# Patient Record
Sex: Male | Born: 2000 | Marital: Single | Smoking: Never smoker
Health system: Southern US, Community
[De-identification: ages and names within clinical notes are randomized; demographics above are authoritative.]

## PROBLEM LIST (undated history)

## (undated) ENCOUNTER — Ambulatory Visit (HOSPITAL_COMMUNITY)

---
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
## 2021-02-27 ENCOUNTER — Emergency Department (HOSPITAL_COMMUNITY)
Admission: EM | Admit: 2021-02-27 | Discharge: 2021-02-27 | Discharge status: Against Medical Advice | Payer: Medicaid Other

## 2021-02-27 ENCOUNTER — Encounter (HOSPITAL_COMMUNITY): Payer: Self-pay

## 2021-02-27 ENCOUNTER — Ambulatory Visit (HOSPITAL_COMMUNITY)
Admission: EM | Admit: 2021-02-27 | Discharge: 2021-02-27 | Disposition: A | Discharge status: Home / Self Care | Payer: Medicaid Other | Attending: Student | Admitting: Student

## 2021-02-27 ENCOUNTER — Other Ambulatory Visit: Payer: Self-pay

## 2021-02-27 DIAGNOSIS — R509 Fever, unspecified: Secondary | ICD-10-CM

## 2021-02-27 DIAGNOSIS — J09X2 Influenza due to identified novel influenza A virus with other respiratory manifestations: Secondary | ICD-10-CM | POA: Diagnosis not present

## 2021-02-27 LAB — POC INFLUENZA A AND B ANTIGEN (URGENT CARE ONLY)
INFLUENZA A ANTIGEN, POC: POSITIVE — AB
INFLUENZA B ANTIGEN, POC: NEGATIVE

## 2021-02-27 MED ORDER — ACETAMINOPHEN 325 MG PO TABS
650.0000 mg | ORAL_TABLET | Freq: Once | ORAL | Status: AC
Start: 2021-02-27 — End: 2021-02-27
  Administered 2021-02-27: 650 mg via ORAL

## 2021-02-27 MED ORDER — PROMETHAZINE-DM 6.25-15 MG/5ML PO SYRP: 5.0000 mL | ORAL_SOLUTION | Freq: Four times a day (QID) | ORAL | 0 refills | Status: AC | PRN

## 2021-02-27 MED ORDER — OSELTAMIVIR PHOSPHATE 75 MG PO CAPS: 75.0000 mg | ORAL_CAPSULE | Freq: Two times a day (BID) | ORAL | 0 refills | Status: AC

## 2021-02-27 MED ORDER — ONDANSETRON 8 MG PO TBDP: 8.0000 mg | ORAL_TABLET | Freq: Three times a day (TID) | ORAL | 0 refills | Status: AC | PRN

## 2021-02-27 MED ORDER — ACETAMINOPHEN 325 MG PO TABS
ORAL_TABLET | ORAL | Status: AC
  Filled 2021-02-27: qty 2

## 2021-02-27 NOTE — ED Triage Notes (Signed)
Pt is c/o fever, cough, sore throat, chills, and headache for the past two days; pt denies exposure to COVID or flu

## 2021-02-27 NOTE — ED Provider Notes (Signed)
[facility]    CSN: [id number] Arrival date & time: 02/27/21  1730      History   Chief Complaint Chief Complaint  Patient presents with   Cough   Fever   Sore Throat   Headache    HPI Edward Bridges is a 20 y.o. male presenting with viral symptoms for 2 days.  Medical history noncontributory.  Following exposure to influenza both at home and school, describes subjective chills, nonproductive cough, sore throat, generalized body aches, headaches.  Advil provides some relief but has not taken this today.  Denies history of cardiopulmonary disease.  Tried to check his temperature at home but the thermometer was broken.  HPI  History reviewed. No pertinent past medical history.  Patient Active Problem List   Diagnosis Date Noted   Acne 06/23/2016    History reviewed. No pertinent surgical history.     Home Medications    Prior to Admission medications   Medication Sig Start Date End Date Taking? Authorizing Provider  ondansetron (ZOFRAN ODT) 8 MG disintegrating tablet Take 1 tablet (8 mg total) by mouth every 8 (eight) hours as needed for nausea or vomiting. 02/27/21  Yes Rhys Martini, PA-C  oseltamivir (TAMIFLU) 75 MG capsule Take 1 capsule (75 mg total) by mouth every 12 (twelve) hours. 02/27/21  Yes Rhys Martini, PA-C  promethazine-dextromethorphan (PROMETHAZINE-DM) 6.25-15 MG/5ML syrup Take 5 mLs by mouth 4 (four) times daily as needed for cough. 02/27/21  Yes Rhys Martini, PA-C  cetirizine (ZYRTEC) 10 MG tablet Take 1 tablet (10 mg total) by mouth at bedtime. Patient not taking: Reported on 06/23/2016 03/02/16   Lowanda Foster, NP  tretinoin (RETIN-A) 0.025 % cream Apply topically at bedtime. Use moisturizer as needed. 03/28/18   Tilman Neat, MD    Family History Family History  Problem Relation Age of Onset   Hypertension Mother    Diabetes Father 65       on oral hypoglycemic for several months   Hypertension Father    Heart disease  Brother 28       rheumatic heart disease   Heart disease Paternal Grandmother    Drug abuse Neg Hx    Early death Neg Hx    Obesity Neg Hx     Social History Social History   Tobacco Use   Smoking status: Never   Smokeless tobacco: Never  Substance Use Topics   Alcohol use: No   Drug use: No     Allergies   Patient has no known allergies.   Review of Systems Review of Systems  Constitutional:  Positive for appetite change, chills, fatigue and fever.  HENT:  Positive for congestion and sore throat. Negative for ear pain, rhinorrhea, sinus pressure and sinus pain.   Eyes:  Negative for redness and visual disturbance.  Respiratory:  Positive for cough. Negative for chest tightness, shortness of breath and wheezing.   Cardiovascular:  Negative for chest pain and palpitations.  Gastrointestinal:  Negative for abdominal pain, constipation, diarrhea, nausea and vomiting.  Genitourinary:  Negative for dysuria, frequency and urgency.  Musculoskeletal:  Positive for myalgias.  Neurological:  Negative for dizziness, weakness and headaches.  Psychiatric/Behavioral:  Negative for confusion.   All other systems reviewed and are negative.   Physical Exam Triage Vital Signs ED Triage Vitals  Enc Vitals Group     BP 02/27/21 1828 125/64     Pulse Rate 02/27/21 1828 (!) 114     Resp 02/27/21 1828  16     Temp 02/27/21 1828 (!) 103.1 F (39.5 C)     Temp Source 02/27/21 1828 Oral     SpO2 02/27/21 1828 97 %     Weight --      Height --      Head Circumference --      Peak Flow --      Pain Score 02/27/21 1830 9     Pain Loc --      Pain Edu? --      Excl. in GC? --    No data found.  Updated Vital Signs BP 125/64 (BP Location: Right Arm)   Pulse (!) 114   Temp (!) 103.1 F (39.5 C) (Oral)   Resp 16   SpO2 97%   Visual Acuity Right Eye Distance:   Left Eye Distance:   Bilateral Distance:    Right Eye Near:   Left Eye Near:    Bilateral Near:     Physical  Exam Vitals reviewed.  Constitutional:      General: He is not in acute distress.    Appearance: Normal appearance. He is ill-appearing.  HENT:     Head: Normocephalic and atraumatic.     Right Ear: Tympanic membrane, ear canal and external ear normal. No tenderness. No middle ear effusion. There is no impacted cerumen. Tympanic membrane is not perforated, erythematous, retracted or bulging.     Left Ear: Tympanic membrane, ear canal and external ear normal. No tenderness.  No middle ear effusion. There is no impacted cerumen. Tympanic membrane is not perforated, erythematous, retracted or bulging.     Nose: Nose normal. No congestion.     Mouth/Throat:     Mouth: Mucous membranes are moist.     Pharynx: Uvula midline. Posterior oropharyngeal erythema present. No oropharyngeal exudate.     Comments: On exam, uvula is midline, she is tolerating her secretions without difficulty, there is no trismus, no drooling, she has normal phonation  Eyes:     Extraocular Movements: Extraocular movements intact.     Pupils: Pupils are equal, round, and reactive to light.  Cardiovascular:     Rate and Rhythm: Regular rhythm. Tachycardia present.     Heart sounds: Normal heart sounds.  Pulmonary:     Effort: Pulmonary effort is normal.     Breath sounds: Normal breath sounds. No decreased breath sounds, wheezing, rhonchi or rales.  Abdominal:     Palpations: Abdomen is soft.     Tenderness: There is no abdominal tenderness. There is no guarding or rebound.  Neurological:     General: No focal deficit present.     Mental Status: He is alert and oriented to person, place, and time.  Psychiatric:        Mood and Affect: Mood normal.        Behavior: Behavior normal.        Thought Content: Thought content normal.        Judgment: Judgment normal.     UC Treatments / Results  Labs (all labs ordered are listed, but only abnormal results are displayed) Labs Reviewed  POC INFLUENZA A AND B ANTIGEN  (URGENT CARE ONLY) - Abnormal; Notable for the following components:      Result Value   INFLUENZA A ANTIGEN, POC POSITIVE (*)    All other components within normal limits    EKG   Radiology No results found.  Procedures Procedures (including critical care time)  Medications Ordered in UC Medications  acetaminophen (TYLENOL) tablet 650 mg (650 mg Oral Given 02/27/21 1838)    Initial Impression / Assessment and Plan / UC Course  I have reviewed the triage vital signs and the nursing notes.  Pertinent labs & imaging results that were available during my care of the patient were reviewed by me and considered in my medical decision making (see chart for details).     This patient is a very pleasant 20 y.o. year old male presenting with influenza A. Febrile at 103.1 and tachycardic at 114.  Oxygenating well and comfortably on room air.  Acetaminophen administered during visit.  Rapid influenza positive.  Tamiflu, promethazine DM, zofran ODT, tylenol/ibuprofen.   ED return precautions discussed. Patient verbalizes understanding and agreement.   Coding Level 4 for acute illness with systemic symptoms, and prescription drug management   Final Clinical Impressions(s) / UC Diagnoses   Final diagnoses:  Influenza due to identified novel influenza A virus with other respiratory manifestations  Febrile illness     Discharge Instructions      -Tamiflu twice daily x5 days. This medication can cause nausea, so I also sent nausea medication. You can stop the Tamiflu if you don't like it or if it causes side effects.  -Take the Zofran (ondansetron) up to 3 times daily for nausea and vomiting. -Promethazine DM cough syrup for congestion/cough. This could make you drowsy, so take at night before bed. -For fevers/chills, bodyaches, headaches- You can take Tylenol up to 1000 mg 3 times daily, and ibuprofen up to 600 mg 3 times daily with food.  You can take these together, or alternate  every 3-4 hours. -Drink plenty of water/gatorade and get plenty of rest -With a virus, you're typically contagious for 5-7 days, or as long as you're having fevers.  -Come back and see [place] if things are getting worse instead of better, like shortness of breath, chest pain, fevers and chills that are getting higher instead of lower and do not come down with Tylenol or ibuprofen, etc.      ED Prescriptions     Medication Sig Dispense Auth. Provider   oseltamivir (TAMIFLU) 75 MG capsule Take 1 capsule (75 mg total) by mouth every 12 (twelve) hours. 10 capsule Ignacia Bayley E, PA-C   ondansetron (ZOFRAN ODT) 8 MG disintegrating tablet Take 1 tablet (8 mg total) by mouth every 8 (eight) hours as needed for nausea or vomiting. 20 tablet Rhys Martini, PA-C   promethazine-dextromethorphan (PROMETHAZINE-DM) 6.25-15 MG/5ML syrup Take 5 mLs by mouth 4 (four) times daily as needed for cough. 118 mL Rhys Martini, PA-C      PDMP not reviewed this encounter.   Rhys Martini, PA-C 02/27/21 1904

## 2021-02-27 NOTE — Discharge Instructions (Addendum)
-  Tamiflu twice daily x5 days. This medication can cause nausea, so I also sent nausea medication. You can stop the Tamiflu if you don't like it or if it causes side effects.  -Take the Zofran (ondansetron) up to 3 times daily for nausea and vomiting. -Promethazine DM cough syrup for congestion/cough. This could make you drowsy, so take at night before bed. -For fevers/chills, bodyaches, headaches- You can take Tylenol up to 1000 mg 3 times daily, and ibuprofen up to 600 mg 3 times daily with food.  You can take these together, or alternate every 3-4 hours. -Drink plenty of water/gatorade and get plenty of rest -With a virus, you're typically contagious for 5-7 days, or as long as you're having fevers.  -Come back and see [place] if things are getting worse instead of better, like shortness of breath, chest pain, fevers and chills that are getting higher instead of lower and do not come down with Tylenol or ibuprofen, etc.

## 2021-12-25 IMAGING — CR DG CHEST 1V PORT
1 series · 1 of 1 positions shown · non-contrast
Comparison: None.

CLINICAL DATA: Cough, fever

EXAM:
PORTABLE CHEST 1 VIEW

[AP]
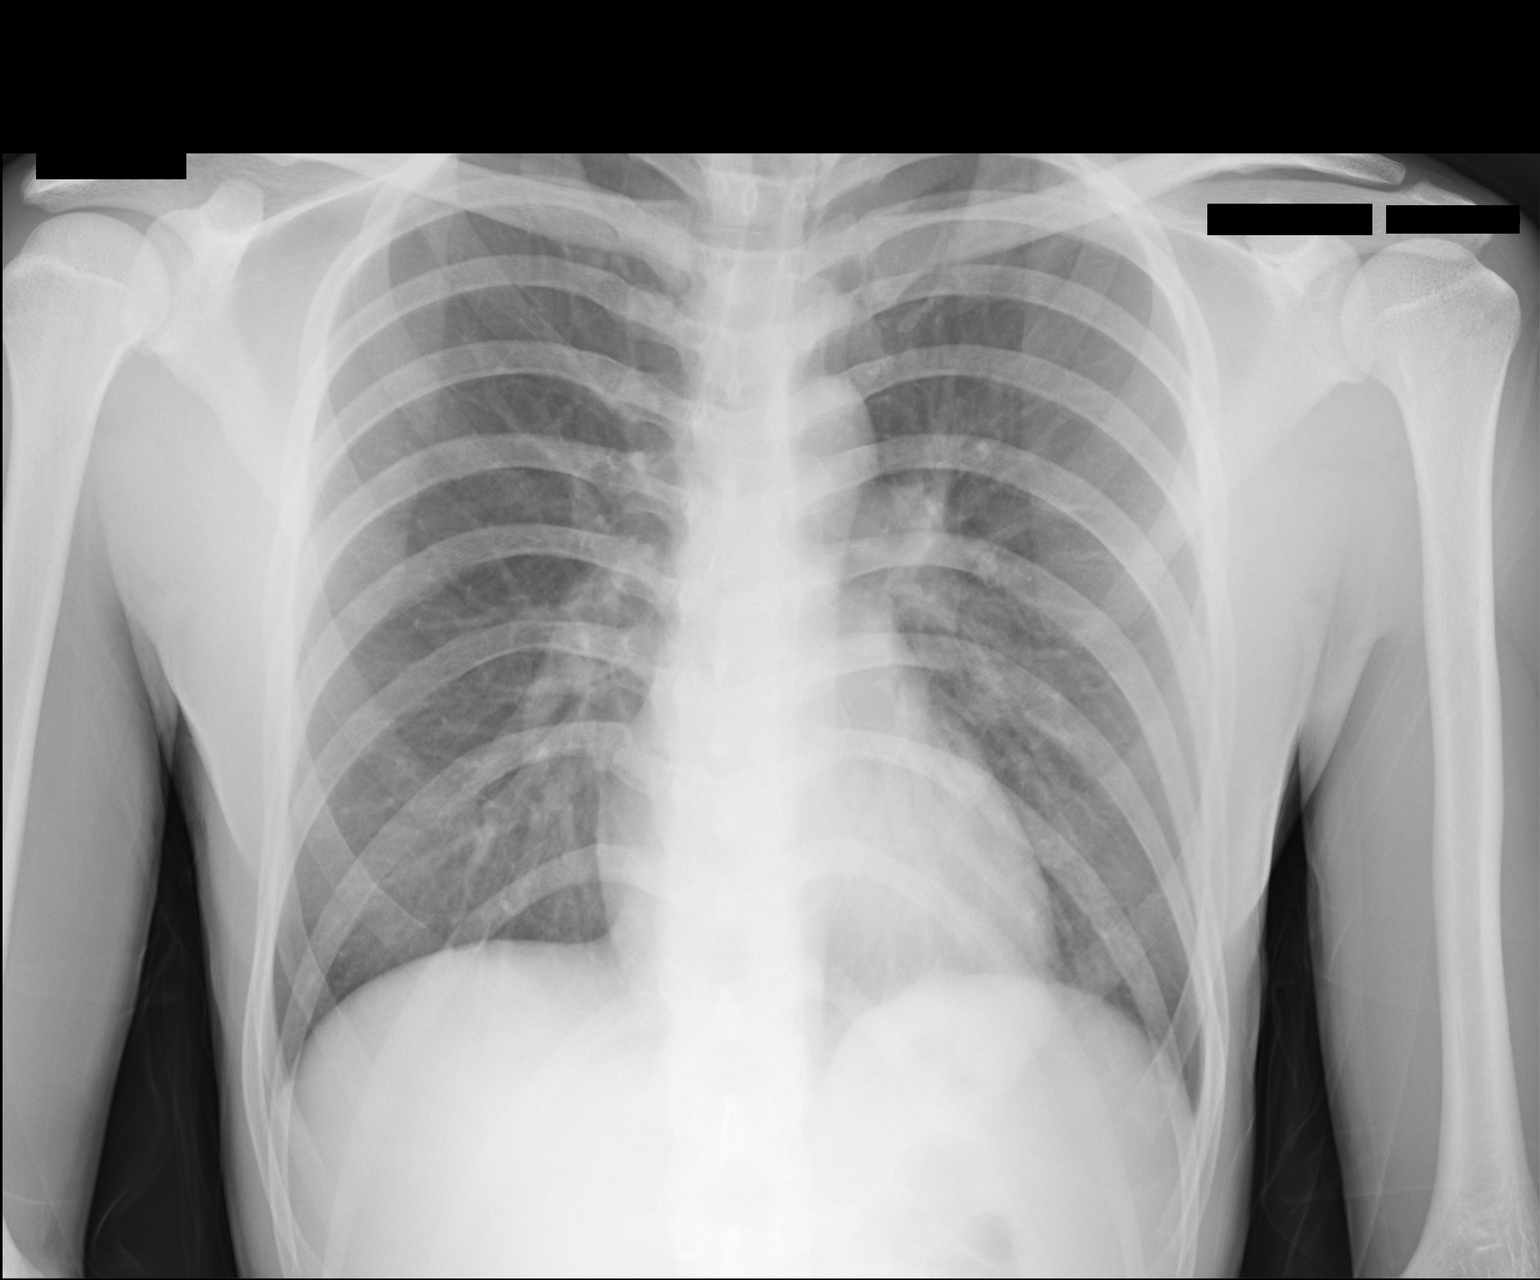

[1 of 1 positions shown; findings below may reference images not displayed]

FINDINGS: Single frontal view of the chest demonstrates an unremarkable
cardiac silhouette. Increased bronchovascular markings in a
bilateral perihilar distribution, left greater than right. No focal
consolidation, effusion, or pneumothorax. No acute bony
abnormalities.
IMPRESSION: 1. Findings most consistent with viral pneumonitis or reactive
airway disease.
